# Patient Record
Sex: Female | Born: 1995 | Race: Black or African American | Hispanic: No | Marital: Single | Smoking: Never smoker
Health system: Southern US, Community
[De-identification: ages and names within clinical notes are randomized; demographics above are authoritative.]

---
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
## 2019-03-08 IMAGING — DX DG ELBOW COMPLETE 3+V*R*
4 series · 4 of 4 positions shown · non-contrast
Comparison: None

CLINICAL DATA: Fell yesterday afternoon off a however board landing
on RIGHT side, RIGHT elbow injury with posterosuperior pain, limited
range of motion

EXAM:
RIGHT ELBOW - COMPLETE 3+ VIEW

[elbow ap]
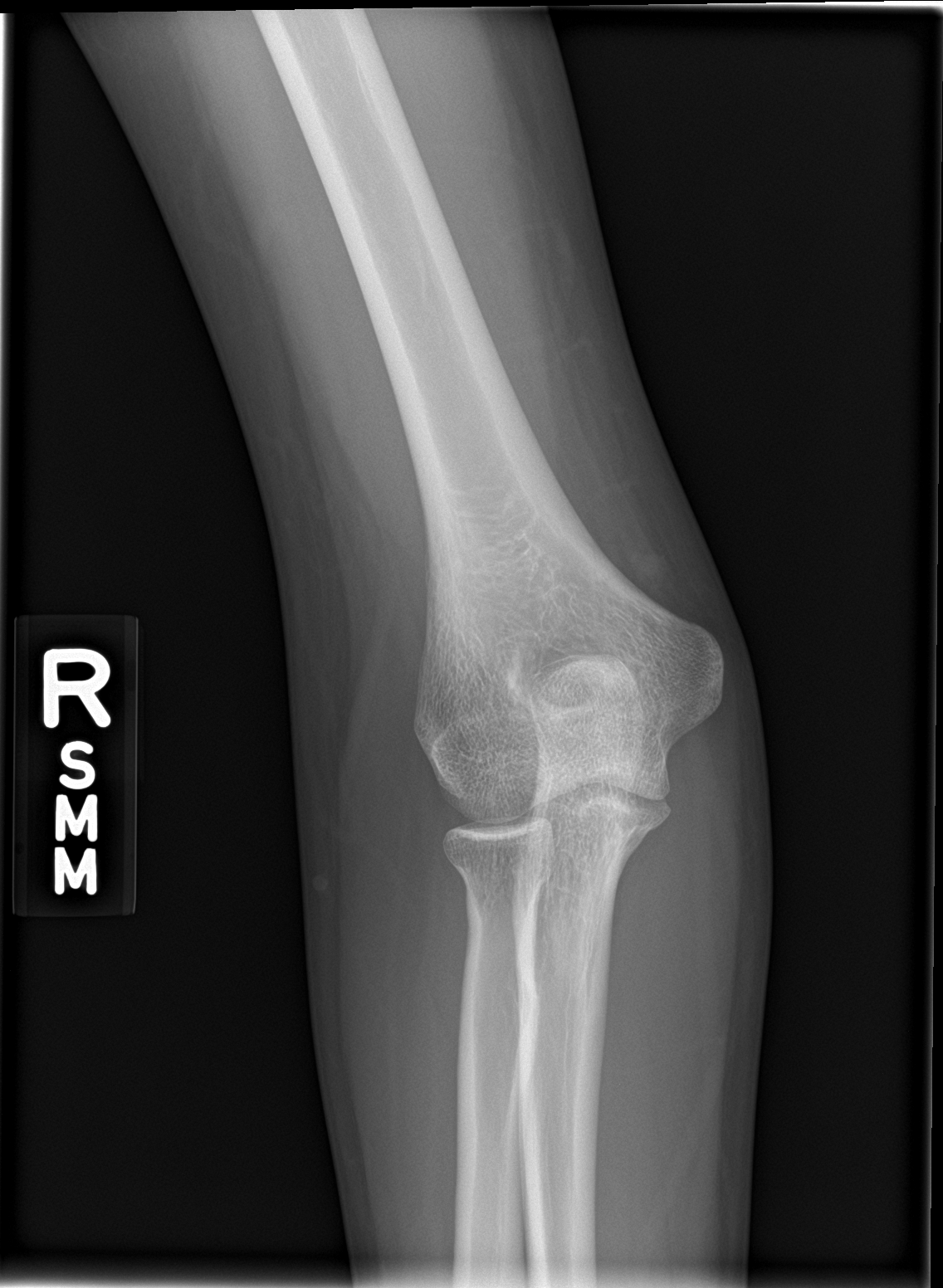

[elbow obl (1 of 2)]
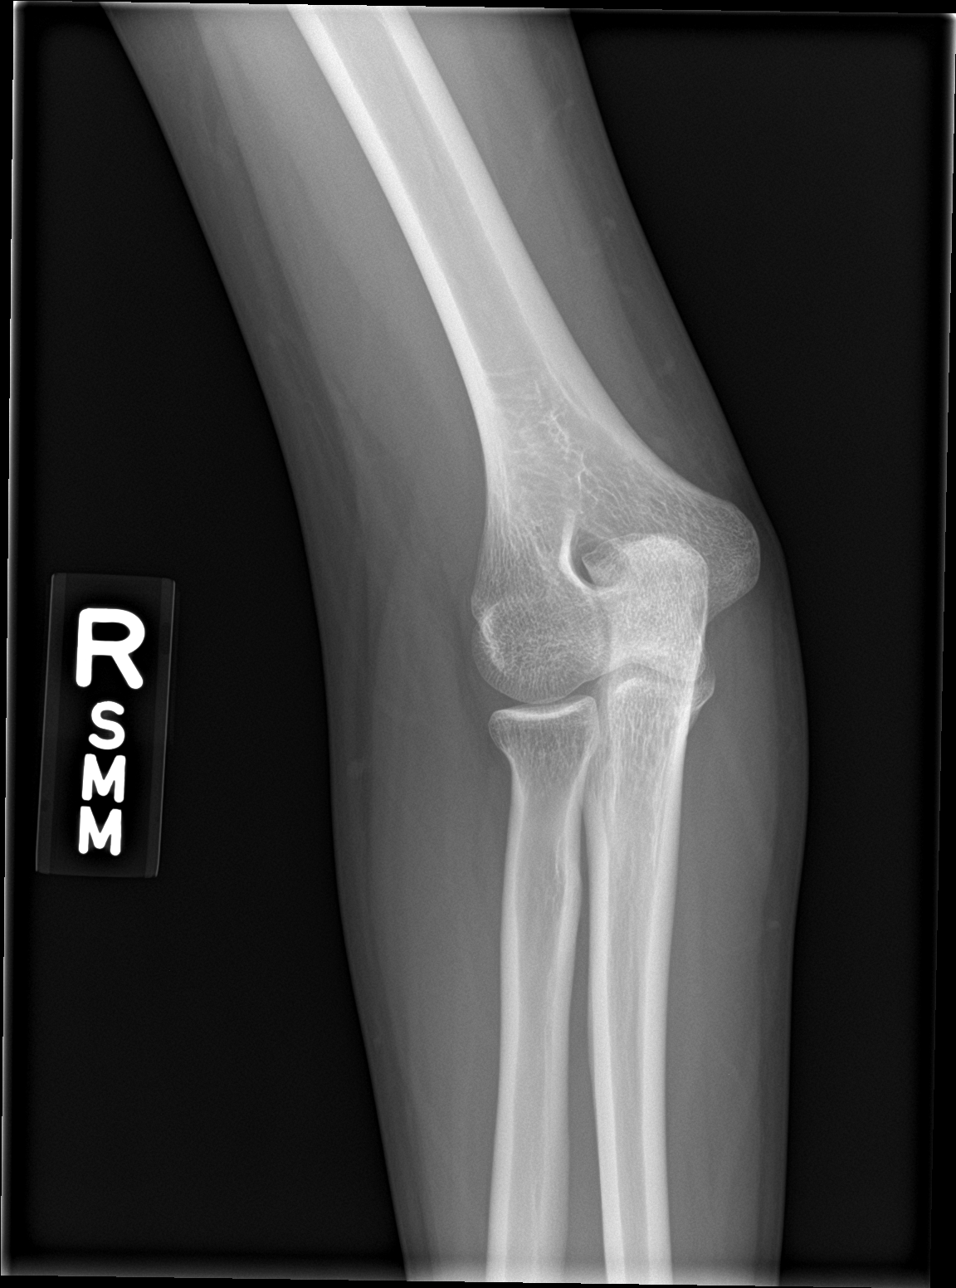

[elbow obl (2 of 2)]
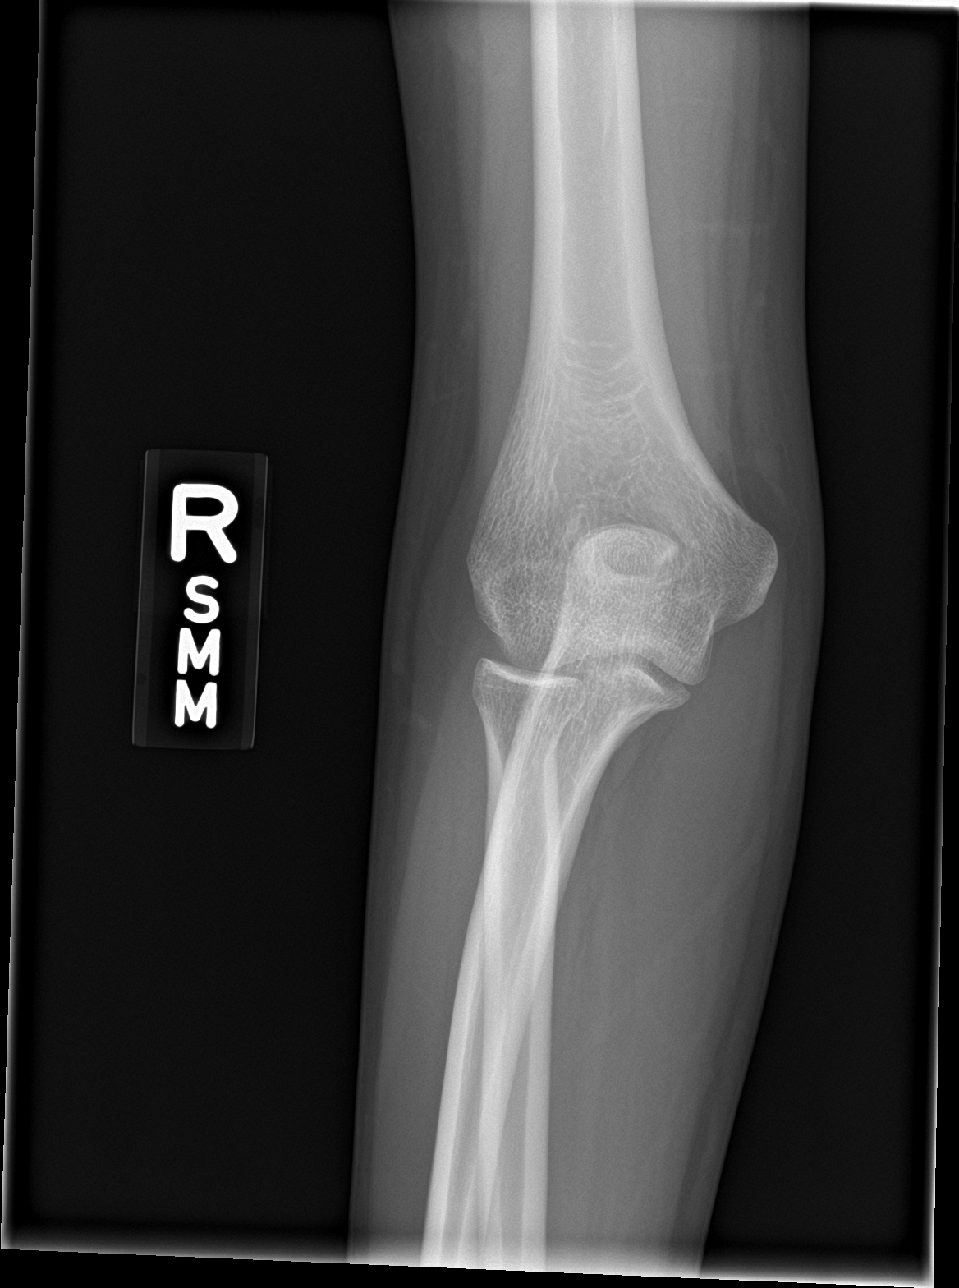

[elbow lat]
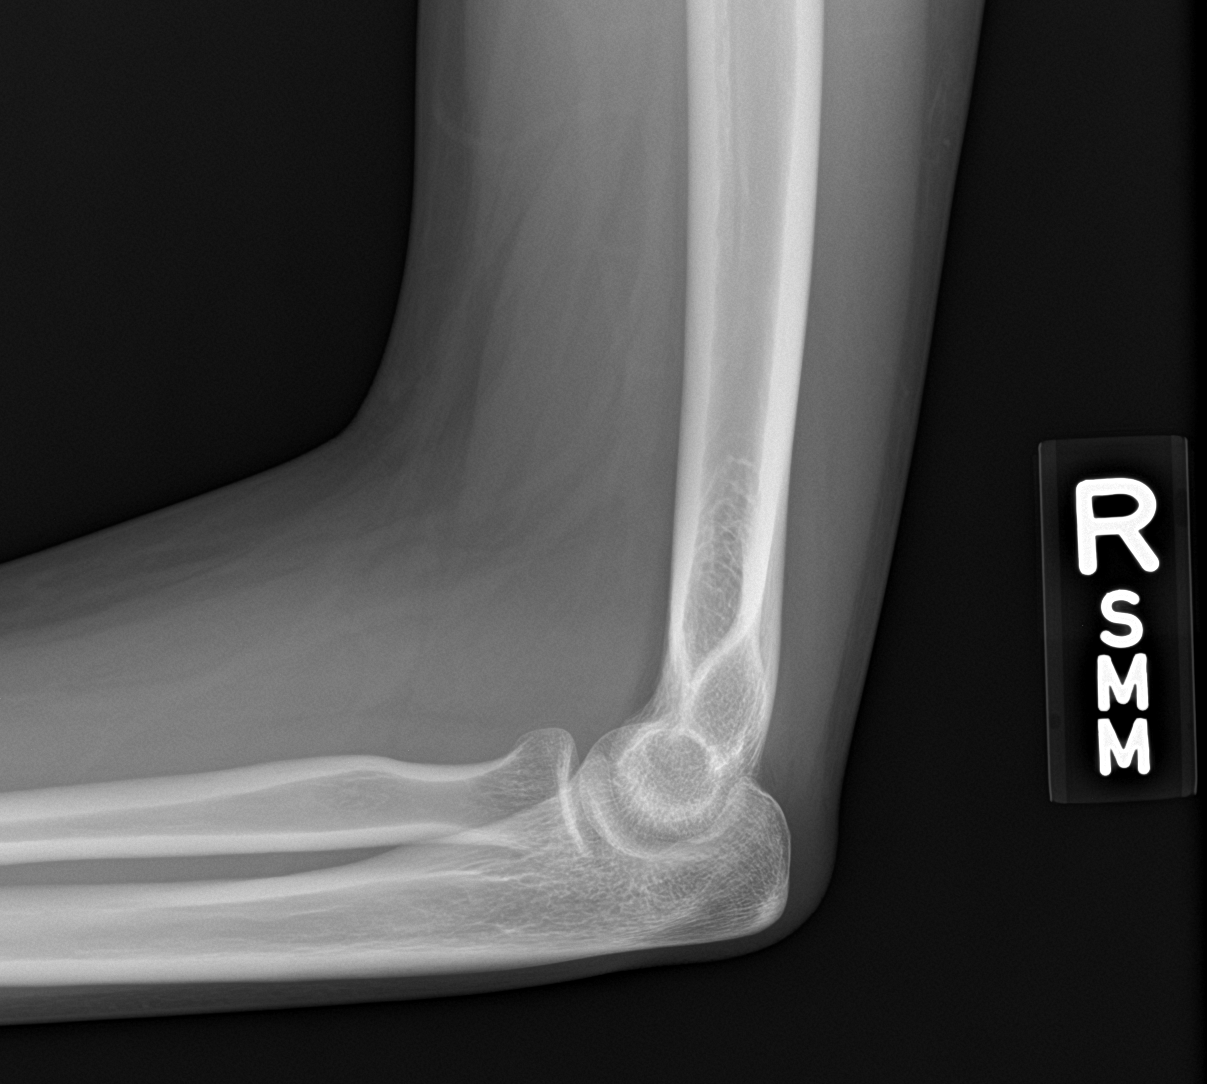

[4 of 4 positions shown; findings below may reference images not displayed]

FINDINGS: Osseous mineralization normal.

Joint spaces preserved.

No acute fracture, dislocation, or bone destruction.

No elbow joint effusion.
IMPRESSION: No acute osseous abnormalities.

## 2019-03-08 IMAGING — DX DG HUMERUS 2V *R*
2 series · 2 of 2 positions shown · non-contrast
Comparison: Right shoulder series same day .

CLINICAL DATA: Fall.  Injury.

EXAM:
RIGHT HUMERUS - 2+ VIEW

[humerus ap]
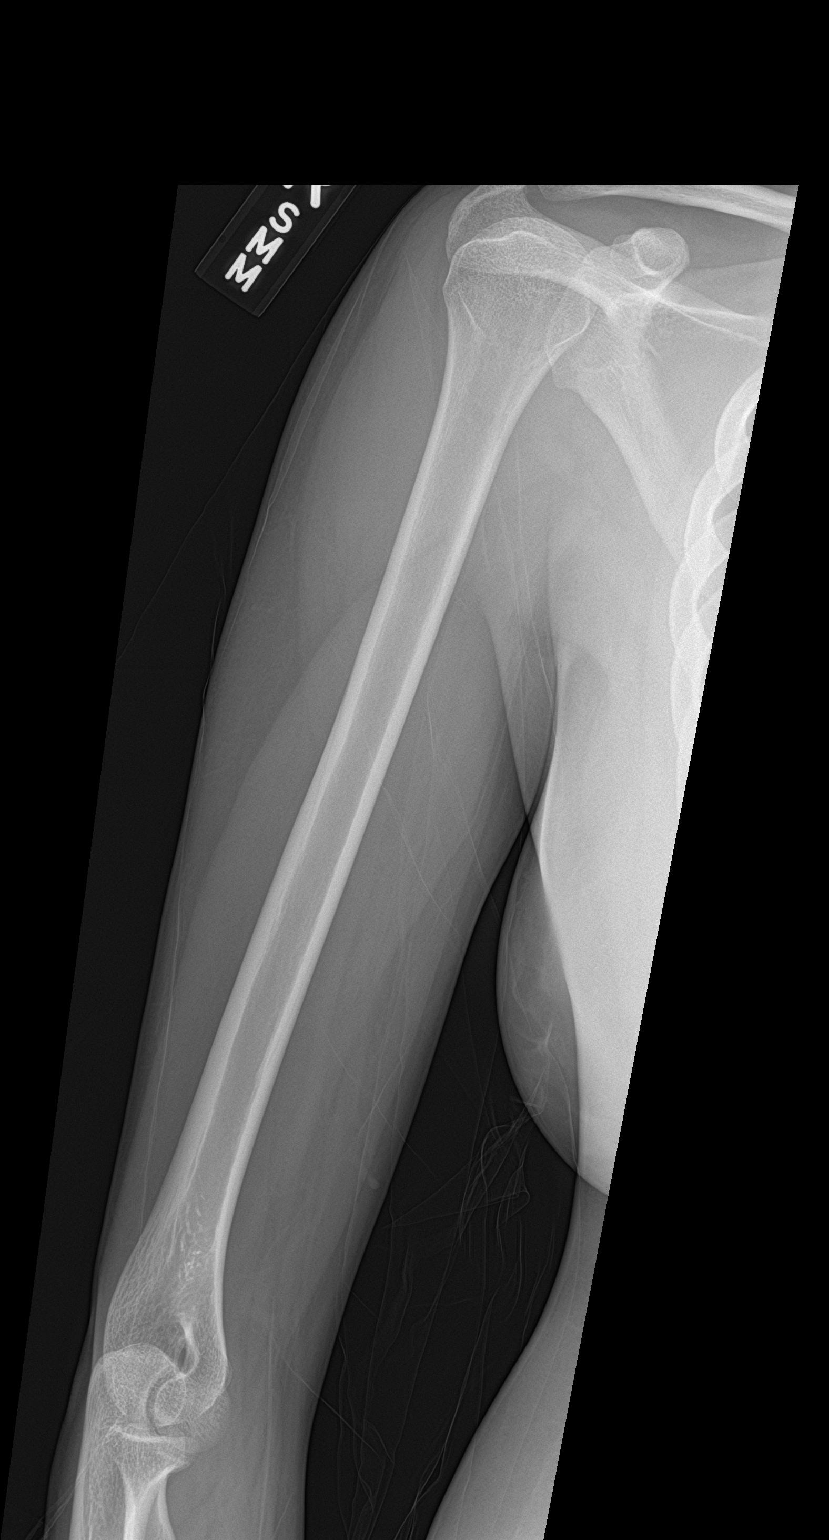

[humerus lat]
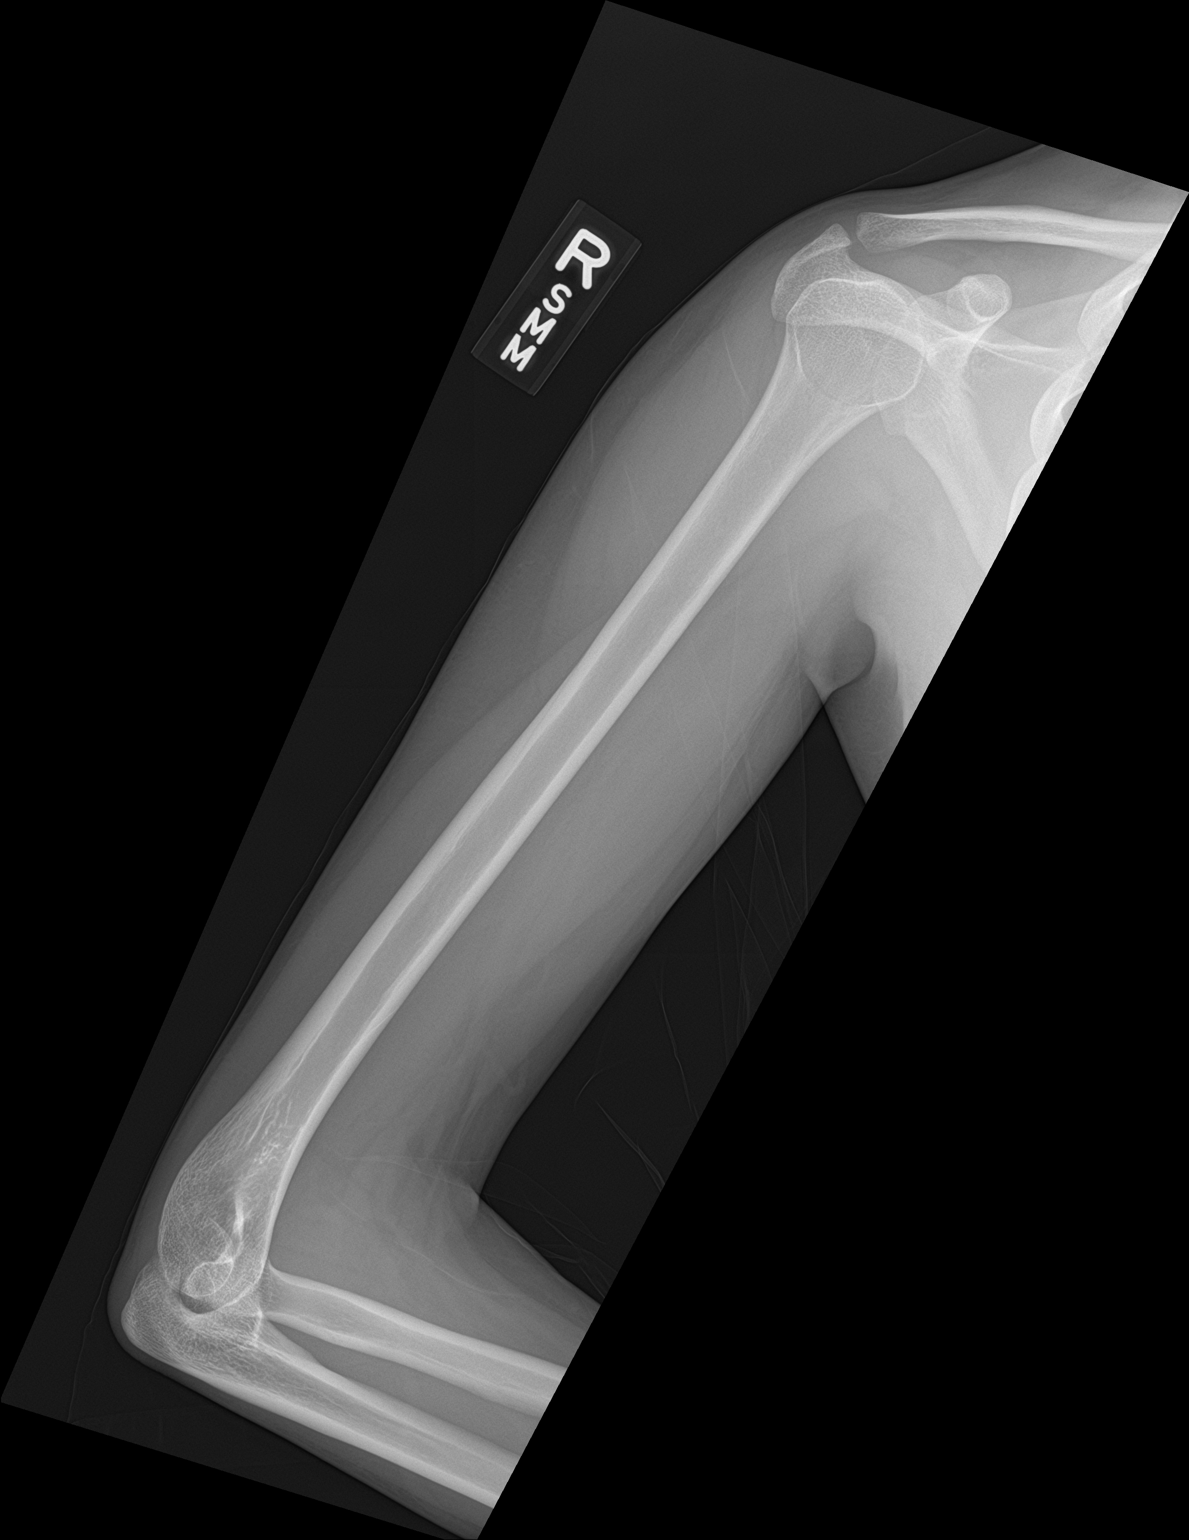

[2 of 2 positions shown; findings below may reference images not displayed]

FINDINGS: No acute bony or joint abnormality identified. No evidence of
fracture. No focal bony abnormality.
IMPRESSION: No acute abnormality.

## 2022-03-11 ENCOUNTER — Ambulatory Visit (HOSPITAL_COMMUNITY)
Admission: EM | Admit: 2022-03-11 | Discharge: 2022-03-11 | Disposition: A | Discharge status: Home / Self Care | Payer: Medicaid Other | Attending: Internal Medicine | Admitting: Internal Medicine

## 2022-03-11 ENCOUNTER — Encounter (HOSPITAL_COMMUNITY): Payer: Self-pay | Admitting: Emergency Medicine

## 2022-03-11 DIAGNOSIS — Z1152 Encounter for screening for COVID-19: Secondary | ICD-10-CM | POA: Diagnosis not present

## 2022-03-11 DIAGNOSIS — R112 Nausea with vomiting, unspecified: Secondary | ICD-10-CM | POA: Diagnosis present

## 2022-03-11 DIAGNOSIS — R059 Cough, unspecified: Secondary | ICD-10-CM | POA: Diagnosis not present

## 2022-03-11 DIAGNOSIS — J069 Acute upper respiratory infection, unspecified: Secondary | ICD-10-CM | POA: Diagnosis not present

## 2022-03-11 MED ORDER — ONDANSETRON 4 MG PO TBDP
ORAL_TABLET | ORAL | Status: AC
  Filled 2022-03-11: qty 1

## 2022-03-11 MED ORDER — ONDANSETRON 4 MG PO TBDP: 4.0000 mg | ORAL_TABLET | Freq: Three times a day (TID) | ORAL | 0 refills | Status: DC | PRN

## 2022-03-11 MED ORDER — BENZONATATE 100 MG PO CAPS: 100.0000 mg | ORAL_CAPSULE | Freq: Three times a day (TID) | ORAL | 0 refills | Status: DC

## 2022-03-11 MED ORDER — ONDANSETRON 4 MG PO TBDP
4.0000 mg | ORAL_TABLET | Freq: Once | ORAL | Status: AC
  Administered 2022-03-11: 4 mg via ORAL

## 2022-03-11 NOTE — ED Notes (Signed)
No answer from lobby  

## 2022-03-11 NOTE — Discharge Instructions (Signed)
You have a viral upper respiratory infection.  COVID-19 testing is pending. We will call you with results if positive. If your COVID test is positive, you must stay at home until day 6 of symptoms. On day 6, you may go out into public and go back to work, but you must wear a mask until day 11 of symptoms to prevent spread to others.  Use the following medicines to help with symptoms: - Purchase zyrtec over the counter (cetirizine) and take this once a day to dry up nasal congestion and runny nose. - Tylenol 1,'000mg'$  and/or ibuprofen '600mg'$  every 6 hours with food as needed for aches/pains or fever/chills.  - Tessalon perles every 8 hours as needed for cough. - Zofran '4mg'$  every 8 hours as needed for nausea/vomiting.  1 tablespoon of honey in warm water and/or salt water gargles may also help with symptoms. Humidifier to your room will help add water to the air and reduce coughing.  If you develop any new or worsening symptoms, please return.  If your symptoms are severe, please go to the emergency room.  Follow-up with your primary care provider for further evaluation and management of your symptoms as well as ongoing wellness visits.  I hope you feel better!

## 2022-03-11 NOTE — ED Triage Notes (Signed)
Pt c/o coughing, chills since Wed. Reports pain in chest with cough. Pt started vomiting today. Took tylenol cough syrup.

## 2022-03-11 NOTE — ED Provider Notes (Signed)
[place]    CSN: [id number] Arrival date & time: 03/11/22  1614      History   Chief Complaint Chief Complaint  Patient presents with   Cough   Emesis    HPI Tracey Curry is a 27 y.o. female.   Patient presents to urgent care for evaluation of cough and runny nose that started 2 days ago, then nausea and vomiting that started this morning.  No recent known sick contacts with similar symptoms.  Last episode of emesis was approximately 30 minutes ago.  She remains nauseous and denies bloody/bilious emesis.  No recent intake of foods outside of her normal diet or water from an unknown source.  No rash, sore throat, dizziness, abdominal pain, diarrhea, constipation, blood/mucus in the stools, headache, vision changes, and fever. Reports chills, hasn't taken temperature at home. Taking tylenol over the counter cough syrup without relief. Denies chest pain, shortness of breath, and heart palpitations. Denies history of chronic respiratory problems.  Non-smoker.   Cough Emesis Associated symptoms: cough     History reviewed. No pertinent past medical history.  There are no problems to display for this patient.   History reviewed. No pertinent surgical history.  OB History   No obstetric history on file.      Home Medications    Prior to Admission medications   Medication Sig Start Date End Date Taking? Authorizing Provider  benzonatate (TESSALON) 100 MG capsule Take 1 capsule (100 mg total) by mouth every 8 (eight) hours. 03/11/22  Yes Talbot Grumbling, FNP  ondansetron (ZOFRAN-ODT) 4 MG disintegrating tablet Take 1 tablet (4 mg total) by mouth every 8 (eight) hours as needed for nausea or vomiting. 03/11/22  Yes StanhopeStasia Cavalier, FNP    Family History No family history on file.  Social History Social History   Tobacco Use   Smoking status: Never   Smokeless tobacco: Never  Substance Use Topics   Alcohol use: No   Drug use: No     Allergies    Patient has no known allergies.   Review of Systems Review of Systems  Respiratory:  Positive for cough.   Gastrointestinal:  Positive for vomiting.  Per HPI   Physical Exam Triage Vital Signs ED Triage Vitals  Enc Vitals Group     BP 03/11/22 1726 (!) 144/91     Pulse Rate 03/11/22 1726 79     Resp 03/11/22 1726 17     Temp 03/11/22 1726 98 F (36.7 C)     Temp Source 03/11/22 1726 Oral     SpO2 03/11/22 1726 98 %     Weight --      Height --      Head Circumference --      Peak Flow --      Pain Score 03/11/22 1725 6     Pain Loc --      Pain Edu? --      Excl. in [place]? --    No data found.  Updated Vital Signs BP (!) 144/91 (BP Location: Left Arm)   Pulse 79   Temp 98 F (36.7 C) (Oral)   Resp 17   LMP 02/25/2022   SpO2 98%   Visual Acuity Right Eye Distance:   Left Eye Distance:   Bilateral Distance:    Right Eye Near:   Left Eye Near:    Bilateral Near:     Physical Exam Vitals and nursing note reviewed.  Constitutional:  Appearance: She is not ill-appearing or toxic-appearing.  HENT:     Head: Normocephalic and atraumatic.     Right Ear: Hearing, tympanic membrane, ear canal and external ear normal.     Left Ear: Hearing, tympanic membrane, ear canal and external ear normal.     Nose: Rhinorrhea present.     Mouth/Throat:     Lips: Pink.     Mouth: Mucous membranes are moist. No injury.     Tongue: No lesions. Tongue does not deviate from midline.     Palate: No mass and lesions.     Pharynx: Oropharynx is clear. Uvula midline. Posterior oropharyngeal erythema present. No pharyngeal swelling, oropharyngeal exudate or uvula swelling.     Tonsils: No tonsillar exudate or tonsillar abscesses.  Eyes:     General: Lids are normal. Vision grossly intact. Gaze aligned appropriately.     Extraocular Movements: Extraocular movements intact.     Conjunctiva/sclera: Conjunctivae normal.  Cardiovascular:     Rate and Rhythm: Normal rate and regular  rhythm.     Heart sounds: Normal heart sounds, S1 normal and S2 normal.  Pulmonary:     Effort: Pulmonary effort is normal. No respiratory distress.     Breath sounds: Normal breath sounds and air entry. No wheezing, rhonchi or rales.  Abdominal:     General: Bowel sounds are normal.     Palpations: Abdomen is soft.     Tenderness: There is no abdominal tenderness. There is no right CVA tenderness, left CVA tenderness or guarding.  Musculoskeletal:     Cervical back: Neck supple.  Lymphadenopathy:     Cervical: No cervical adenopathy.  Skin:    General: Skin is warm and dry.     Capillary Refill: Capillary refill takes less than 2 seconds.     Findings: No rash.  Neurological:     General: No focal deficit present.     Mental Status: She is alert and oriented to person, place, and time. Mental status is at baseline.     Cranial Nerves: No dysarthria or facial asymmetry.  Psychiatric:        Mood and Affect: Mood normal.        Speech: Speech normal.        Behavior: Behavior normal.        Thought Content: Thought content normal.        Judgment: Judgment normal.      UC Treatments / Results  Labs (all labs ordered are listed, but only abnormal results are displayed) Labs Reviewed  SARS CORONAVIRUS 2 (TAT 6-24 HRS)    EKG   Radiology No results found.  Procedures Procedures (including critical care time)  Medications Ordered in UC Medications  ondansetron (ZOFRAN-ODT) disintegrating tablet 4 mg (4 mg Oral Given 03/11/22 1822)    Initial Impression / Assessment and Plan / UC Course  I have reviewed the triage vital signs and the nursing notes.  Pertinent labs & imaging results that were available during my care of the patient were reviewed by me and considered in my medical decision making (see chart for details).   1. Viral URI with cough Symptoms and physical exam consistent with a viral upper respiratory tract infection that will likely resolve with rest,  fluids, and prescriptions for symptomatic relief. Deferred imaging based on stable cardiopulmonary exam and hemodynamically stable vital signs. COVID-19 testing is pending.  We will call patient if this is positive.  Quarantine guidelines discussed. Currently on day 3 of  symptoms and does not qualify for antiviral therapy.   Patient given Zofran '4mg'$  ODT in clinic today for nausea.   Zofran and tessalon perles sent to pharmacy for symptomatic relief to be taken as prescribed.  May continue taking over the counter medications as directed for further symptomatic relief. Bland diet advised for the next 12-24 hours until able to tolerate foods and fluids without emesis. Nonpharmacologic interventions for symptom relief provided and after visit summary below. Advised to push fluids to stay well hydrated while recovering from viral illness.   Discussed physical exam and available lab work findings in clinic with patient.  Counseled patient regarding appropriate use of medications and potential side effects for all medications recommended or prescribed today. Discussed red flag signs and symptoms of worsening condition,when to call the PCP office, return to urgent care, and when to seek higher level of care in the emergency department. Patient verbalizes understanding and agreement with plan. All questions answered. Patient discharged in stable condition.    Final Clinical Impressions(s) / UC Diagnoses   Final diagnoses:  Viral URI with cough  Nausea and vomiting, unspecified vomiting type     Discharge Instructions      You have a viral upper respiratory infection.  COVID-19 testing is pending. We will call you with results if positive. If your COVID test is positive, you must stay at home until day 6 of symptoms. On day 6, you may go out into public and go back to work, but you must wear a mask until day 11 of symptoms to prevent spread to others.  Use the following medicines to help with  symptoms: - Purchase zyrtec over the counter (cetirizine) and take this once a day to dry up nasal congestion and runny nose. - Tylenol 1,'000mg'$  and/or ibuprofen '600mg'$  every 6 hours with food as needed for aches/pains or fever/chills.  - Tessalon perles every 8 hours as needed for cough. - Zofran '4mg'$  every 8 hours as needed for nausea/vomiting.  1 tablespoon of honey in warm water and/or salt water gargles may also help with symptoms. Humidifier to your room will help add water to the air and reduce coughing.  If you develop any new or worsening symptoms, please return.  If your symptoms are severe, please go to the emergency room.  Follow-up with your primary care provider for further evaluation and management of your symptoms as well as ongoing wellness visits.  I hope you feel better!    ED Prescriptions     Medication Sig Dispense Auth. Provider   benzonatate (TESSALON) 100 MG capsule Take 1 capsule (100 mg total) by mouth every 8 (eight) hours. 21 capsule Joella Prince M, FNP   ondansetron (ZOFRAN-ODT) 4 MG disintegrating tablet Take 1 tablet (4 mg total) by mouth every 8 (eight) hours as needed for nausea or vomiting. 20 tablet Talbot Grumbling, FNP      PDMP not reviewed this encounter.   Talbot Grumbling,  03/11/22 1831

## 2022-03-12 LAB — SARS CORONAVIRUS 2 (TAT 6-24 HRS): SARS Coronavirus 2: NEGATIVE

## 2023-06-15 ENCOUNTER — Encounter (HOSPITAL_COMMUNITY): Payer: Self-pay

## 2023-06-15 ENCOUNTER — Ambulatory Visit (INDEPENDENT_AMBULATORY_CARE_PROVIDER_SITE_OTHER)

## 2023-06-15 ENCOUNTER — Ambulatory Visit (HOSPITAL_COMMUNITY)
Admission: RE | Admit: 2023-06-15 | Discharge: 2023-06-15 | Disposition: A | Discharge status: Home / Self Care | Payer: Self-pay | Source: Ambulatory Visit | Attending: Family Medicine | Admitting: Family Medicine

## 2023-06-15 VITALS — BP 126/85 | HR 75 | Temp 98.5°F | Resp 14

## 2023-06-15 DIAGNOSIS — M25571 Pain in right ankle and joints of right foot: Secondary | ICD-10-CM

## 2023-06-15 MED ORDER — PREDNISONE 20 MG PO TABS: 40.0000 mg | ORAL_TABLET | Freq: Every day | ORAL | 0 refills | Status: AC

## 2023-06-15 NOTE — Discharge Instructions (Addendum)
 X-ray done today and is negative for an acute fracture or changes. This does not rule out a ligament injury although given the duration of time since the original injury this is less likely. We will treat with a strong anti-inflammatory and a brace and if symptoms do not improve, then will need to follow up with an orthopedist. We will treat with the following:  Prednisone 40 mg (2 tablets) once daily for 5 days. Take this in the morning.  This is a steroid to help with inflammation and pain. Ice the area 2-3 times daily for 10-15 minutes to help with pain and swelling. Do not apply ice directly to the skin.  Wear the brace when up walking or standing for the next 1- 2 weeks.  If symptoms do not improve, then will need to follow up with an orthopedist Can return to urgent care as needed.

## 2023-06-15 NOTE — ED Provider Notes (Signed)
 [facility]    CSN: [id number] Arrival date & time: 06/15/23  1301      History   Chief Complaint Chief Complaint  Patient presents with   Ankle Pain    Entered by patient    HPI Tracey Curry is a 28 y.o. female.   28 y.o. female who presents to urgent care with complaints of right ankle pain. She reports that about a month ago she jumped off a step and hurt her right ankle. She thought it was just a sprain and did not seek any medical care. The area seemed to have gotten better but at work she has been doing a lot more with walking, standing and lifting. A few days ago the ankle started to swell and became painful again especially on the lateral aspect just above the lateral malleolus. She denies any locking, catching or giving out. She has been taking tylenol for pain. She has not used a brace or ice. She denies any other injury.      Ankle Pain Associated symptoms: no back pain and no fever     History reviewed. No pertinent past medical history.  There are no active problems to display for this patient.   History reviewed. No pertinent surgical history.  OB History   No obstetric history on file.      Home Medications    Prior to Admission medications   Medication Sig Start Date End Date Taking? Authorizing Provider  benzonatate  (TESSALON ) 100 MG capsule Take 1 capsule (100 mg total) by mouth every 8 (eight) hours. 03/11/22   Starlene Eaton, FNP  ondansetron  (ZOFRAN -ODT) 4 MG disintegrating tablet Take 1 tablet (4 mg total) by mouth every 8 (eight) hours as needed for nausea or vomiting. 03/11/22   Starlene Eaton, FNP  predniSONE (DELTASONE) 20 MG tablet Take 2 tablets (40 mg total) by mouth daily with breakfast for 5 days. 06/15/23 06/20/23 Yes Kreg Pesa, PA-C    Family History History reviewed. No pertinent family history.  Social History Social History   Tobacco Use   Smoking status: Never   Smokeless tobacco: Never  Vaping  Use   Vaping status: Every Day   Substances: Nicotine, Flavoring  Substance Use Topics   Alcohol use: No   Drug use: No     Allergies   Patient has no known allergies.   Review of Systems Review of Systems  Constitutional:  Negative for chills and fever.  HENT:  Negative for ear pain and sore throat.   Eyes:  Negative for pain and visual disturbance.  Respiratory:  Negative for cough and shortness of breath.   Cardiovascular:  Negative for chest pain and palpitations.  Gastrointestinal:  Negative for abdominal pain and vomiting.  Genitourinary:  Negative for dysuria and hematuria.  Musculoskeletal:  Negative for arthralgias and back pain.       Right lateral ankle pain  Skin:  Negative for color change and rash.  Neurological:  Negative for seizures and syncope.  All other systems reviewed and are negative.    Physical Exam Triage Vital Signs ED Triage Vitals [06/15/23 1320]  Encounter Vitals Group     BP 126/85     Systolic BP Percentile      Diastolic BP Percentile      Pulse Rate 75     Resp 14     Temp 98.5 F (36.9 C)     Temp Source Oral     SpO2 95 %  Weight      Height      Head Circumference      Peak Flow      Pain Score 6     Pain Loc      Pain Education      Exclude from Growth Chart    No data found.  Updated Vital Signs BP 126/85 (BP Location: Left Arm)   Pulse 75   Temp 98.5 F (36.9 C) (Oral)   Resp 14   LMP 06/14/2023   SpO2 95%   Visual Acuity Right Eye Distance:   Left Eye Distance:   Bilateral Distance:    Right Eye Near:   Left Eye Near:    Bilateral Near:     Physical Exam Vitals and nursing note reviewed.  Constitutional:      General: She is not in acute distress.    Appearance: She is well-developed.  HENT:     Head: Normocephalic and atraumatic.  Eyes:     Conjunctiva/sclera: Conjunctivae normal.  Cardiovascular:     Rate and Rhythm: Normal rate and regular rhythm.     Heart sounds: No murmur  heard. Pulmonary:     Effort: Pulmonary effort is normal. No respiratory distress.     Breath sounds: Normal breath sounds.  Abdominal:     Palpations: Abdomen is soft.     Tenderness: There is no abdominal tenderness.  Musculoskeletal:        General: No swelling.     Cervical back: Neck supple.     Right ankle: Tenderness present over the lateral malleolus. Normal range of motion. Anterior drawer test negative. Normal pulse.     Right Achilles Tendon: Normal.     Comments: Mild tenderness just above the lateral malleolus.   Skin:    General: Skin is warm and dry.     Capillary Refill: Capillary refill takes less than 2 seconds.  Neurological:     Mental Status: She is alert.  Psychiatric:        Mood and Affect: Mood normal.      UC Treatments / Results  Labs (all labs ordered are listed, but only abnormal results are displayed) Labs Reviewed - No data to display  EKG   Radiology DG Ankle Complete Right Result Date: 06/15/2023 CLINICAL DATA:  Right ankle pain EXAM: RIGHT ANKLE - COMPLETE 3+ VIEW COMPARISON:  None Available. FINDINGS: There is no evidence of fracture, dislocation, or joint effusion. There is no evidence of arthropathy or other focal bone abnormality. Soft tissues are unremarkable. IMPRESSION: Negative. Electronically Signed   By: Fredrich Jefferson M.D.   On: 06/15/2023 13:58    Procedures Procedures (including critical care time)  Medications Ordered in UC Medications - No data to display  Initial Impression / Assessment and Plan / UC Course  I have reviewed the triage vital signs and the nursing notes.  Pertinent labs & imaging results that were available during my care of the patient were reviewed by me and considered in my medical decision making (see chart for details).     Acute right ankle pain   X-ray done today and is negative for an acute fracture or changes. This does not rule out a ligament injury although given the duration of time since  the original injury this is less likely. We will treat with a strong anti-inflammatory and a brace and if symptoms do not improve, then will need to follow up with an orthopedist. We will treat with the following:  Prednisone 40 mg (2 tablets) once daily for 5 days. Take this in the morning.  This is a steroid to help with inflammation and pain. Ice the area 2-3 times daily for 10-15 minutes to help with pain and swelling. Do not apply ice directly to the skin.  Wear the brace when up walking or standing for the next 1- 2 weeks.  If symptoms do not improve, then will need to follow up with an orthopedist Can return to urgent care as needed.   Final Clinical Impressions(s) / UC Diagnoses   Final diagnoses:  Acute right ankle pain     Discharge Instructions      X-ray done today and is negative for an acute fracture or changes. This does not rule out a ligament injury although given the duration of time since the original injury this is less likely. We will treat with a strong anti-inflammatory and a brace and if symptoms do not improve, then will need to follow up with an orthopedist. We will treat with the following:  Prednisone 40 mg (2 tablets) once daily for 5 days. Take this in the morning.  This is a steroid to help with inflammation and pain. Ice the area 2-3 times daily for 10-15 minutes to help with pain and swelling. Do not apply ice directly to the skin.  Wear the brace when up walking or standing for the next 1- 2 weeks.  If symptoms do not improve, then will need to follow up with an orthopedist Can return to urgent care as needed.    ED Prescriptions     Medication Sig Dispense Auth. Provider   predniSONE (DELTASONE) 20 MG tablet Take 2 tablets (40 mg total) by mouth daily with breakfast for 5 days. 10 tablet Kreg Pesa, [place]      PDMP not reviewed this encounter.   Kreg Pesa, [place] 06/15/23 1407

## 2023-06-15 NOTE — ED Triage Notes (Signed)
 Patient reports that she jumped off of a step months ago and her right ankle was sore, but in the past 2-3 days she has noticed increased pain of the right ankle. 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  Patient has been taking Tylenol for pain.

## 2023-09-25 ENCOUNTER — Ambulatory Visit (HOSPITAL_COMMUNITY)
Admission: RE | Admit: 2023-09-25 | Discharge: 2023-09-25 | Disposition: A | Discharge status: Home / Self Care | Payer: Self-pay | Source: Ambulatory Visit | Attending: Emergency Medicine | Admitting: Emergency Medicine

## 2023-09-25 ENCOUNTER — Encounter (HOSPITAL_COMMUNITY): Payer: Self-pay

## 2023-09-25 VITALS — BP 164/106 | HR 69 | Temp 98.4°F | Resp 16

## 2023-09-25 DIAGNOSIS — J069 Acute upper respiratory infection, unspecified: Secondary | ICD-10-CM | POA: Diagnosis not present

## 2023-09-25 DIAGNOSIS — R030 Elevated blood-pressure reading, without diagnosis of hypertension: Secondary | ICD-10-CM | POA: Diagnosis not present

## 2023-09-25 LAB — POC SARS CORONAVIRUS 2 AG -  ED: SARS Coronavirus 2 Ag: NEGATIVE

## 2023-09-25 MED ORDER — IBUPROFEN 800 MG PO TABS: 800.0000 mg | ORAL_TABLET | Freq: Three times a day (TID) | ORAL | 0 refills | Status: AC

## 2023-09-25 MED ORDER — PROMETHAZINE-DM 6.25-15 MG/5ML PO SYRP: 5.0000 mL | ORAL_SOLUTION | Freq: Four times a day (QID) | ORAL | 0 refills | Status: AC | PRN

## 2023-09-25 NOTE — ED Provider Notes (Signed)
 [facility]    CSN: [id number] Arrival date & time: 09/25/23  1424      History   Chief Complaint Chief Complaint  Patient presents with   Fever    Body pain, headaches, and coughing. - Entered by patient    HPI Tracey Curry is a 28 y.o. female.  3 day history of tactile fever (sweats), chills, body aches, cough and nasal congestion. No abd pain, NVD, rash, sore throat.  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 No pertinent past medical history.  There are no active problems to display for this patient.   History reviewed. No pertinent surgical history.  OB History   No obstetric history on file.      Home Medications    Prior to Admission medications   Medication Sig Start Date End Date Taking? Authorizing Provider  ibuprofen  (ADVIL ) 800 MG tablet Take 1 tablet (800 mg total) by mouth 3 (three) times daily. 09/25/23  Yes Koy Lamp, [place], PA-C  promethazine -dextromethorphan (PROMETHAZINE -DM) 6.25-15 MG/5ML syrup Take 5 mLs by mouth 4 (four) times daily as needed for cough. 09/25/23  Yes Ming Mcmannis, Asberry RIGGERS    Family History History reviewed. No pertinent family history.  Social History Social History   Tobacco Use   Smoking status: Never   Smokeless tobacco: Never  Vaping Use   Vaping status: Every Day   Substances: Nicotine, Flavoring  Substance Use Topics   Alcohol use: Yes    Comment: occasionally   Drug use: No     Allergies   Patient has no known allergies.   Review of Systems Review of Systems As per HPI  Physical Exam Triage Vital Signs ED Triage Vitals  Encounter Vitals Group     BP 09/25/23 1444 (!) 163/116     Girls Systolic BP Percentile --      Girls Diastolic BP Percentile --      Boys Systolic BP Percentile --      Boys Diastolic BP Percentile --      Pulse Rate 09/25/23 1444 69     Resp 09/25/23 1444 16     Temp 09/25/23 1444 98.4 F (36.9 C)     Temp Source  09/25/23 1444 Oral     SpO2 09/25/23 1444 97 %     Weight --      Height --      Head Circumference --      Peak Flow --      Pain Score 09/25/23 1447 0     Pain Loc --      Pain Education --      Exclude from Growth Chart --    No data found.  Updated Vital Signs BP (!) 164/106 (BP Location: Left Arm) Comment: with small cuff  Pulse 69   Temp 98.4 F (36.9 C) (Oral)   Resp 16   LMP 09/20/2023 (Approximate)   SpO2 97%    Physical Exam Vitals and nursing note reviewed.  Constitutional:      Appearance: She is not ill-appearing.  HENT:     Right Ear: Tympanic membrane and ear canal normal.     Left Ear: Tympanic membrane and ear canal normal.     Nose: No rhinorrhea.     Mouth/Throat:     Mouth: Mucous membranes are moist.     Pharynx: Oropharynx is clear. No oropharyngeal exudate or posterior oropharyngeal erythema.  Eyes:     Conjunctiva/sclera: Conjunctivae normal.  Cardiovascular:     Rate and Rhythm: Normal rate and regular rhythm.     Pulses: Normal pulses.     Heart sounds: Normal heart sounds.  Pulmonary:     Effort: Pulmonary effort is normal. No respiratory distress.     Breath sounds: Normal breath sounds. No wheezing.  Musculoskeletal:     Cervical back: Normal range of motion. No rigidity or tenderness.  Lymphadenopathy:     Cervical: No cervical adenopathy.  Skin:    General: Skin is warm and dry.  Neurological:     Mental Status: She is alert and oriented to person, place, and time.      UC Treatments / Results  Labs (all labs ordered are listed, but only abnormal results are displayed) Labs Reviewed  POC SARS CORONAVIRUS 2 AG -  ED    EKG   Radiology No results found.  Procedures Procedures (including critical care time)  Medications Ordered in UC Medications - No data to display  Initial Impression / Assessment and Plan / UC Course  I have reviewed the triage vital signs and the nursing notes.  Pertinent labs & imaging  results that were available during my care of the patient were reviewed by me and considered in my medical decision making (see chart for details).  Afebrile in clinic, well appearing, clear lungs Rapid covid negative Viral etiology, supportive care.  Have sent ibuprofen  and Promethazine  DM.  Likely prognosis is discussed, return precautions.  Elevated BP without diagnosis of hypertension Recheck 164/106 I have advised establishing with a primary care to have this rechecked, further management if needed.  Patient is agreeable with plan  Final Clinical Impressions(s) / UC Diagnoses   Final diagnoses:  Viral URI  Elevated BP without diagnosis of hypertension     Discharge Instructions      Ibuprofen  can be used every 6 hours for aches/fever You can also use tylenol I recommend plain guaifenesin (Mucinex) for congestion.  This works if you are very hydrated to keep drinking lots of fluids. The promethazine  DM cough syrup can be used up to 4 times daily. If this medication makes you drowsy, take only once before bed.  You can scan the QR code on the last page to get established with a primary care provider. I recommend to follow up with one as soon as able to discuss blood pressure.      ED Prescriptions     Medication Sig Dispense Auth. Provider   ibuprofen  (ADVIL ) 800 MG tablet Take 1 tablet (800 mg total) by mouth 3 (three) times daily. 21 tablet Darlina Mccaughey, PA-C   promethazine -dextromethorphan (PROMETHAZINE -DM) 6.25-15 MG/5ML syrup Take 5 mLs by mouth 4 (four) times daily as needed for cough. 240 mL Siniyah Evangelist, [place], PA-C      PDMP not reviewed this encounter.   Jeryl Asberry, PA-C 09/25/23 1545

## 2023-09-25 NOTE — ED Triage Notes (Signed)
 Patient here today with c/o body aches, fever, chills, headaches sweats, and cough, nasal congestion X 3-4 days. Patient has taken Nyquil with some relief. 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

## 2023-09-25 NOTE — Discharge Instructions (Addendum)
 Ibuprofen  can be used every 6 hours for aches/fever You can also use tylenol I recommend plain guaifenesin (Mucinex) for congestion.  This works if you are very hydrated to keep drinking lots of fluids. The promethazine  DM cough syrup can be used up to 4 times daily. If this medication makes you drowsy, take only once before bed.  You can scan the QR code on the last page to get established with a primary care provider. I recommend to follow up with one as soon as able to discuss blood pressure.
